# Patient Record
Sex: Male | Born: 2000 | Race: White | Hispanic: No | Marital: Single | Smoking: Never smoker
Health system: Southern US, Community
[De-identification: ages and names within clinical notes are randomized; demographics above are authoritative.]

## PROBLEM LIST (undated history)

## (undated) HISTORY — PX: APPENDECTOMY: SHX54

---
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
## 2020-07-12 IMAGING — CT CT HEAD W/O CM
4 series · 17 of 47 positions shown, 19 images · non-contrast
Comparison: None.

CLINICAL DATA: Headache

EXAM:
CT HEAD WITHOUT CONTRAST
TECHNIQUE: Contiguous axial images were obtained from the base of the skull
through the vertex without intravenous contrast.

[Series 3: head without · axial · non-contrast · 0.41mm/px · z∈[-80,+30]mm · 7 of 30 slices shown, 9 images]
[im 4/30  brain]
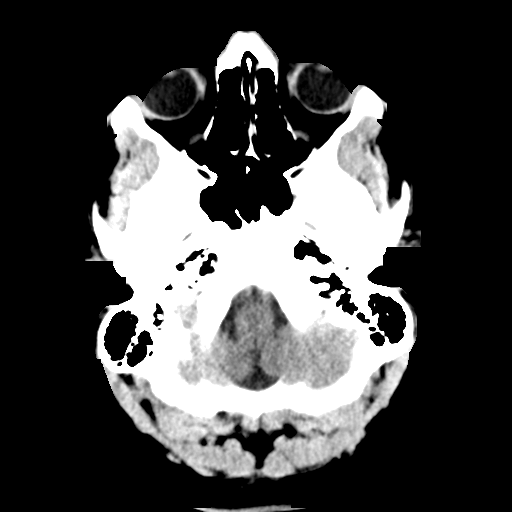
[im 4/30  bone]
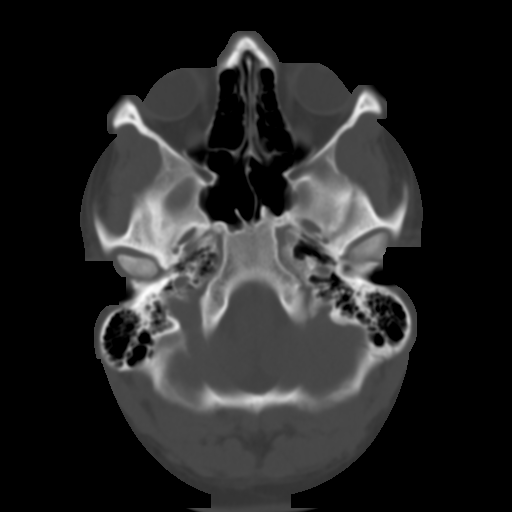
[im 8/30  brain]
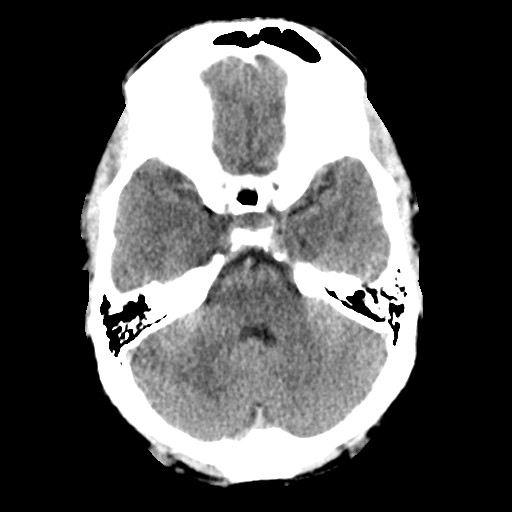
[im 11/30  brain]
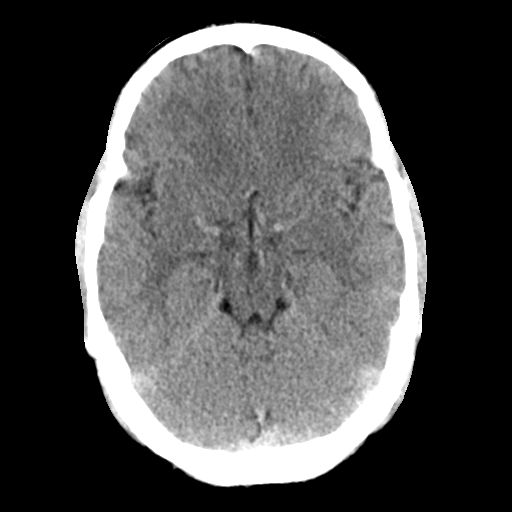
[im 15/30  brain]
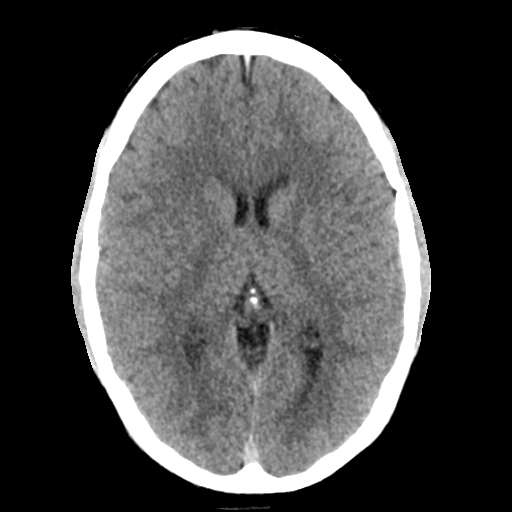
[im 19/30  brain]
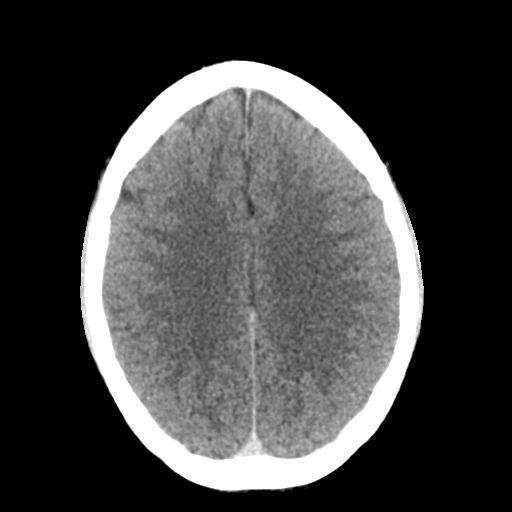
[im 19/30  bone]
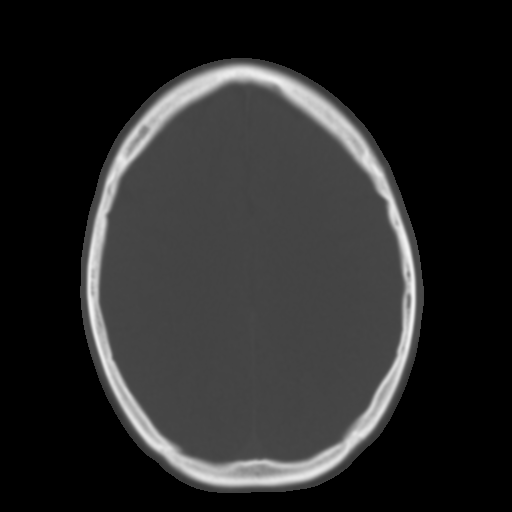
[im 22/30  brain]
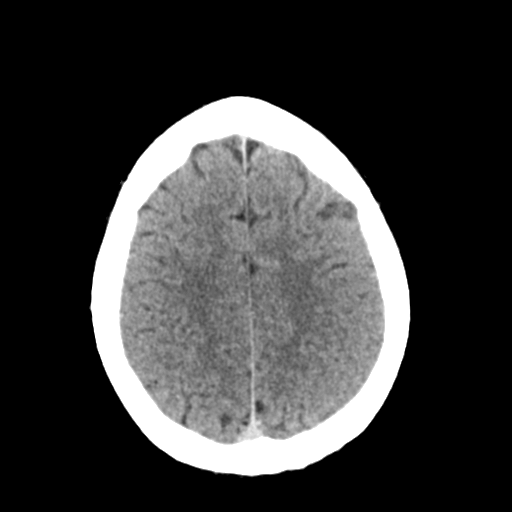
[im 26/30  brain]
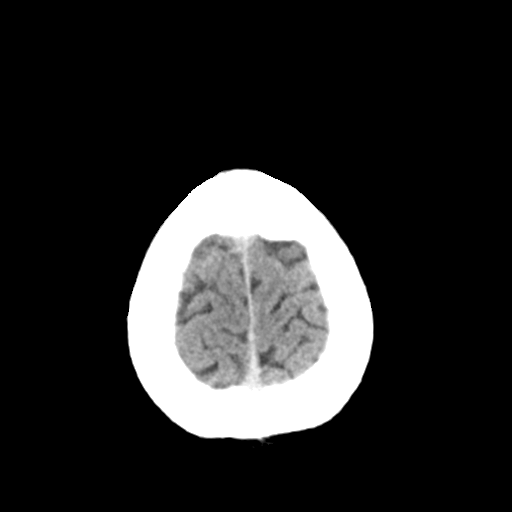

[Series 4: head bone · axial · 0.41mm/px · z∈[-81,-31]mm · 4 of 74 slices shown]
[im 8/74  bone]
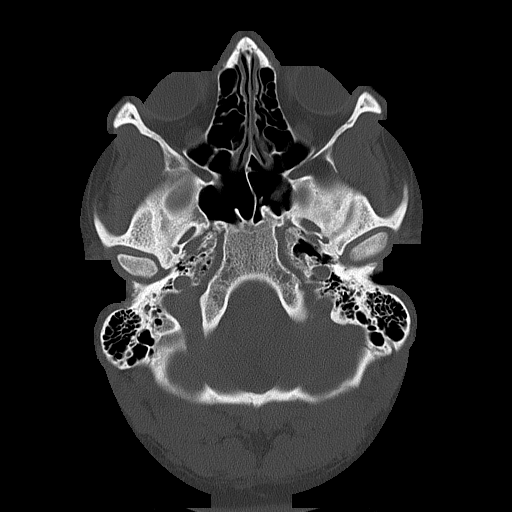
[im 15/74  bone]
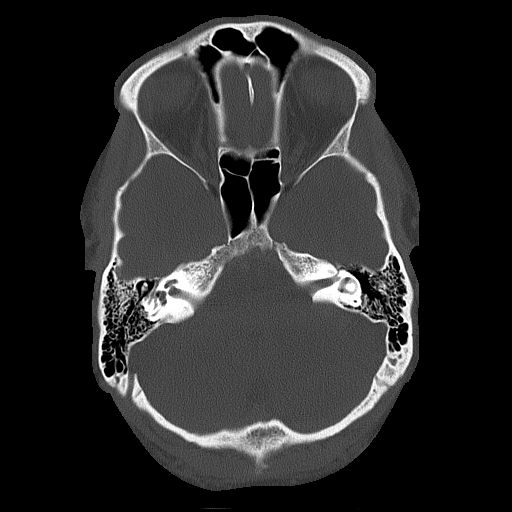
[im 22/74  bone]
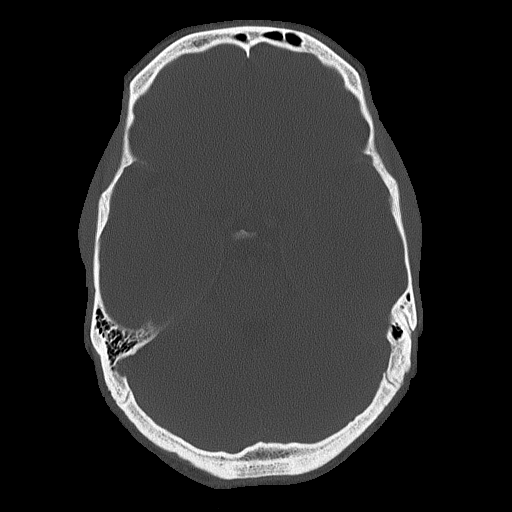
[im 33/74  bone]
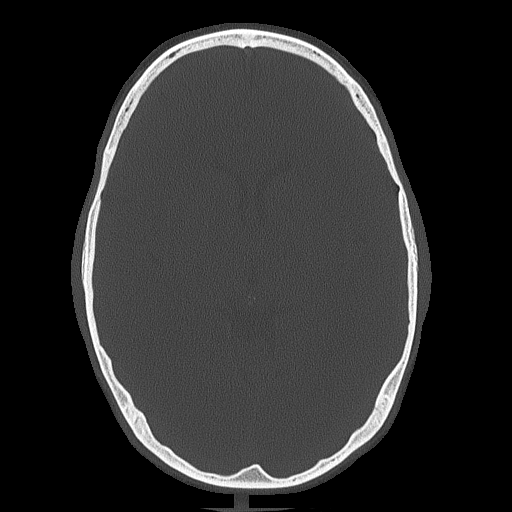

[Series 5: head without cor · coronal · non-contrast · 0.30mm/px · 3 of 67 slices shown]
[im 23/67  brain]
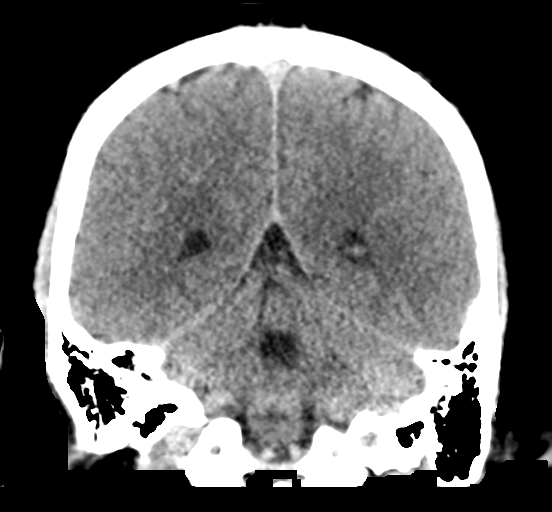
[im 30/67  brain]
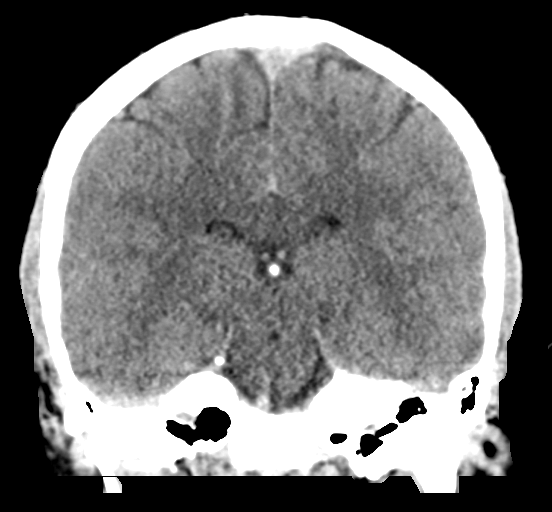
[im 37/67  brain]
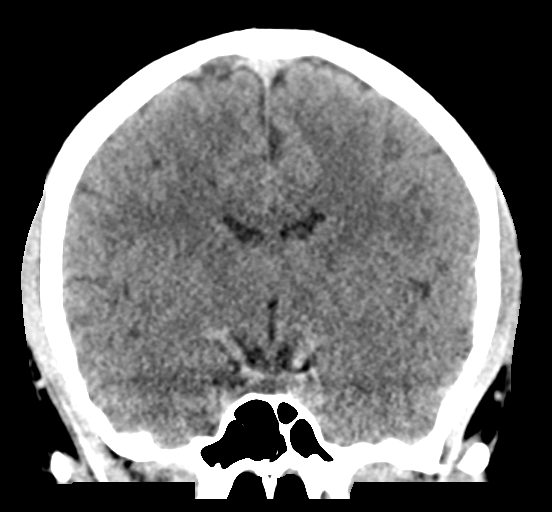

[Series 6: head without sag · sagittal · non-contrast · 0.30mm/px · 3 of 56 slices shown]
[im 19/56  brain]
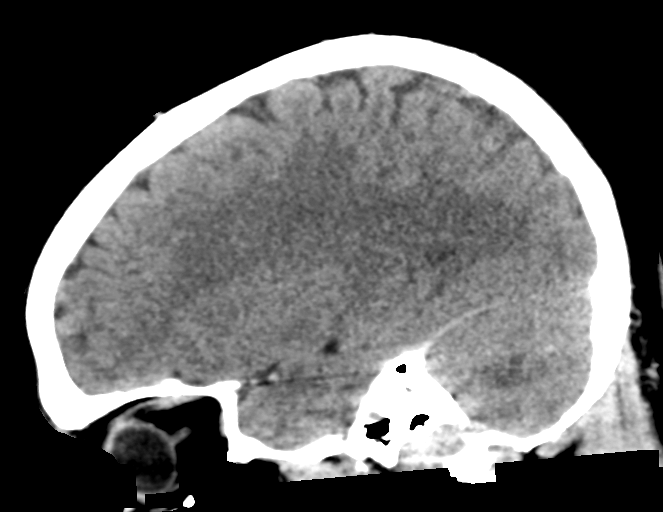
[im 28/56  brain]
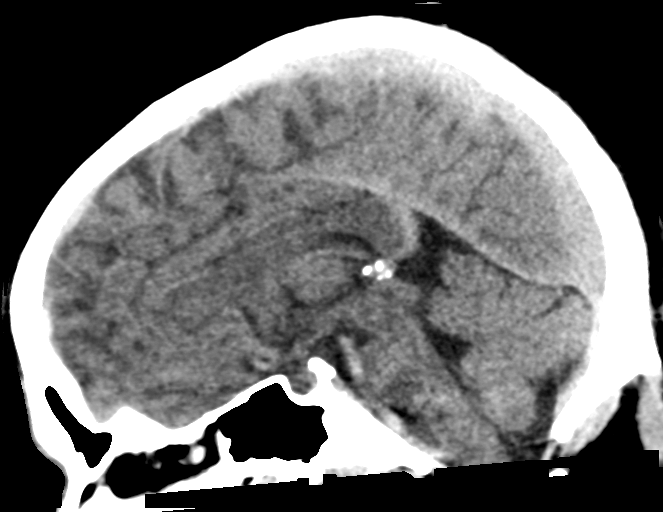
[im 37/56  brain]
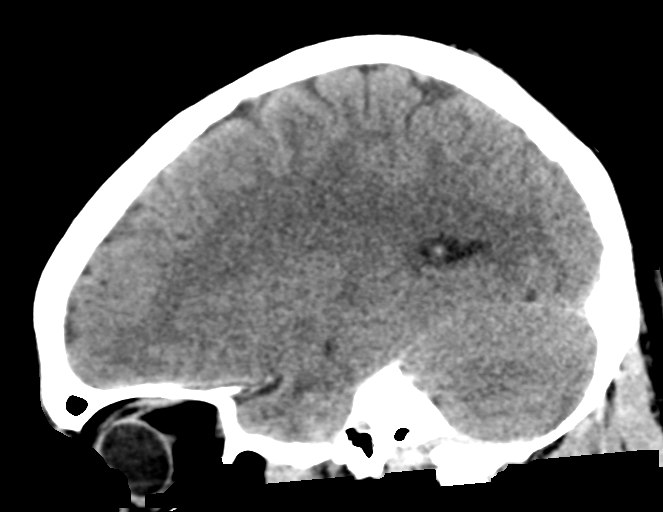

[17 of 47 positions shown; findings below may reference images not displayed]

FINDINGS: Brain: No acute intracranial abnormality. Specifically, no
hemorrhage, hydrocephalus, mass lesion, acute infarction, or
significant intracranial injury.

Vascular: No hyperdense vessel or unexpected calcification.

Skull: No acute calvarial abnormality.

Sinuses/Orbits: Visualized paranasal sinuses and mastoids clear.
Orbital soft tissues unremarkable.

Other: None
IMPRESSION: Normal study.

## 2022-03-25 ENCOUNTER — Emergency Department (HOSPITAL_COMMUNITY): Payer: Self-pay

## 2022-03-25 ENCOUNTER — Encounter (HOSPITAL_COMMUNITY): Payer: Self-pay

## 2022-03-25 ENCOUNTER — Other Ambulatory Visit: Payer: Self-pay

## 2022-03-25 ENCOUNTER — Emergency Department (HOSPITAL_COMMUNITY)
Admission: EM | Admit: 2022-03-25 | Discharge: 2022-03-25 | Disposition: A | Discharge status: Home / Self Care | Payer: Self-pay | Attending: Emergency Medicine | Admitting: Emergency Medicine

## 2022-03-25 DIAGNOSIS — R61 Generalized hyperhidrosis: Secondary | ICD-10-CM | POA: Insufficient documentation

## 2022-03-25 DIAGNOSIS — S71102A Unspecified open wound, left thigh, initial encounter: Secondary | ICD-10-CM | POA: Insufficient documentation

## 2022-03-25 DIAGNOSIS — Z23 Encounter for immunization: Secondary | ICD-10-CM | POA: Insufficient documentation

## 2022-03-25 DIAGNOSIS — R531 Weakness: Secondary | ICD-10-CM | POA: Insufficient documentation

## 2022-03-25 DIAGNOSIS — W3400XA Accidental discharge from unspecified firearms or gun, initial encounter: Secondary | ICD-10-CM | POA: Insufficient documentation

## 2022-03-25 LAB — CBC
HCT: 43.6 % (ref 39.0–52.0)
HCT: 39.2 % (ref 39.0–52.0)
Hemoglobin: 14.6 g/dL (ref 13.0–17.0)
Hemoglobin: 13.7 g/dL (ref 13.0–17.0)
MCH: 29.4 pg (ref 26.0–34.0)
MCH: 30 pg (ref 26.0–34.0)
MCHC: 33.5 g/dL (ref 30.0–36.0)
MCHC: 34.9 g/dL (ref 30.0–36.0)
MCV: 87.7 fL (ref 80.0–100.0)
MCV: 85.8 fL (ref 80.0–100.0)
Platelets: 271 10*3/uL (ref 150–400)
Platelets: 204 10*3/uL (ref 150–400)
RBC: 4.97 MIL/uL (ref 4.22–5.81)
RBC: 4.57 MIL/uL (ref 4.22–5.81)
RDW: 12.4 % (ref 11.5–15.5)
RDW: 12.5 % (ref 11.5–15.5)
WBC: 9.9 10*3/uL (ref 4.0–10.5)
WBC: 12.6 10*3/uL — ABNORMAL HIGH (ref 4.0–10.5)
nRBC: 0 % (ref 0.0–0.2)
nRBC: 0 % (ref 0.0–0.2)

## 2022-03-25 LAB — URINALYSIS, ROUTINE W REFLEX MICROSCOPIC
Bilirubin Urine: NEGATIVE
Glucose, UA: NEGATIVE mg/dL
Hgb urine dipstick: NEGATIVE
Ketones, ur: NEGATIVE mg/dL
Leukocytes,Ua: NEGATIVE
Nitrite: NEGATIVE
Protein, ur: NEGATIVE mg/dL
Specific Gravity, Urine: 1.013 (ref 1.005–1.030)
pH: 7 (ref 5.0–8.0)

## 2022-03-25 LAB — COMPREHENSIVE METABOLIC PANEL
ALT: 28 U/L (ref 0–44)
AST: 29 U/L (ref 15–41)
Albumin: 4.4 g/dL (ref 3.5–5.0)
Alkaline Phosphatase: 86 U/L (ref 38–126)
Anion gap: 11 (ref 5–15)
BUN: 10 mg/dL (ref 6–20)
CO2: 23 mmol/L (ref 22–32)
Calcium: 9.4 mg/dL (ref 8.9–10.3)
Chloride: 105 mmol/L (ref 98–111)
Creatinine, Ser: 1.26 mg/dL — ABNORMAL HIGH (ref 0.61–1.24)
GFR, Estimated: >60 mL/min (ref >60)
Glucose, Bld: 149 mg/dL — ABNORMAL HIGH (ref 70–99)
Potassium: 3.6 mmol/L (ref 3.5–5.1)
Sodium: 139 mmol/L (ref 135–145)
Total Bilirubin: 0.9 mg/dL (ref 0.3–1.2)
Total Protein: 6.5 g/dL (ref 6.5–8.1)

## 2022-03-25 LAB — I-STAT CHEM 8, ED
BUN: 10 mg/dL (ref 6–20)
Calcium, Ion: 1.09 mmol/L — ABNORMAL LOW (ref 1.15–1.40)
Chloride: 104 mmol/L (ref 98–111)
Creatinine, Ser: 1.2 mg/dL (ref 0.61–1.24)
Glucose, Bld: 142 mg/dL — ABNORMAL HIGH (ref 70–99)
HCT: 43 % (ref 39.0–52.0)
Hemoglobin: 14.6 g/dL (ref 13.0–17.0)
Potassium: 3.4 mmol/L — ABNORMAL LOW (ref 3.5–5.1)
Sodium: 142 mmol/L (ref 135–145)
TCO2: 21 mmol/L — ABNORMAL LOW (ref 22–32)

## 2022-03-25 LAB — SAMPLE TO BLOOD BANK

## 2022-03-25 LAB — LACTIC ACID, PLASMA
Lactic Acid, Venous: 2.9 mmol/L (ref 0.5–1.9)
Lactic Acid, Venous: 0.9 mmol/L (ref 0.5–1.9)

## 2022-03-25 LAB — PROTIME-INR
INR: 1.1 (ref 0.8–1.2)
Prothrombin Time: 13.8 seconds (ref 11.4–15.2)

## 2022-03-25 LAB — ETHANOL: Alcohol, Ethyl (B): <10 mg/dL (ref <10)

## 2022-03-25 MED ORDER — KETOROLAC TROMETHAMINE 15 MG/ML IJ SOLN
15.0000 mg | Freq: Once | INTRAMUSCULAR | Status: AC
  Administered 2022-03-25: 15 mg via INTRAVENOUS
  Filled 2022-03-25: qty 1

## 2022-03-25 MED ORDER — OXYCODONE-ACETAMINOPHEN 5-325 MG PO TABS
1.0000 | ORAL_TABLET | Freq: Once | ORAL | Status: AC
  Administered 2022-03-25: 1 via ORAL
  Filled 2022-03-25: qty 1

## 2022-03-25 MED ORDER — HYDROCODONE-ACETAMINOPHEN 5-325 MG PO TABS: 1.0000 | ORAL_TABLET | Freq: Four times a day (QID) | ORAL | 0 refills | Status: AC | PRN

## 2022-03-25 MED ORDER — CEPHALEXIN 500 MG PO CAPS: 500.0000 mg | ORAL_CAPSULE | Freq: Two times a day (BID) | ORAL | 0 refills | Status: AC

## 2022-03-25 MED ORDER — TETANUS-DIPHTH-ACELL PERTUSSIS 5-2.5-18.5 LF-MCG/0.5 IM SUSY
0.5000 mL | PREFILLED_SYRINGE | Freq: Once | INTRAMUSCULAR | Status: AC
  Administered 2022-03-25: 0.5 mL via INTRAMUSCULAR

## 2022-03-25 MED ORDER — CEPHALEXIN 250 MG PO CAPS
500.0000 mg | ORAL_CAPSULE | Freq: Once | ORAL | Status: AC
  Administered 2022-03-25: 500 mg via ORAL
  Filled 2022-03-25: qty 2

## 2022-03-25 MED ORDER — LACTATED RINGERS IV BOLUS
1000.0000 mL | Freq: Once | INTRAVENOUS | Status: AC
  Administered 2022-03-25: 1000 mL via INTRAVENOUS

## 2022-03-25 MED ORDER — FENTANYL CITRATE PF 50 MCG/ML IJ SOSY
25.0000 ug | PREFILLED_SYRINGE | Freq: Once | INTRAMUSCULAR | Status: AC
  Administered 2022-03-25: 25 ug via INTRAVENOUS
  Filled 2022-03-25: qty 1

## 2022-03-25 NOTE — Progress Notes (Signed)
Orthopedic Tech Progress Note Patient Details:  Mauricio Dahlen [birth year] [id number]  Ortho Devices Type of Ortho Device: Crutches Ortho Device/Splint Interventions: Ordered, Application, Adjustment   Post Interventions Patient Tolerated: Well Instructions Provided: Adjustment of device, Care of device, Poper ambulation with device  Paije Goodhart L Charletha Dalpe 03/25/2022, 10:41 PM
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
## 2022-03-25 NOTE — Discharge Instructions (Addendum)
Take ibuprofen every 6 hours to minimize pain and soreness.  Call the number below to set up a follow-up appointment in the trauma clinic.  The following medications were sent to your pharmacy: -Keflex is an antibiotic to avoid an infection.  Take this as prescribed for the next 5 days. -Norco is a narcotic pain medication.  Take this only as needed.  Risks associated with these type of medications are addiction and depression of your respiratory drive.  Take with extreme caution.  Your pain should gradually begin to improve after tomorrow.  If you experience pain out of proportion or if areas of wound become increasingly red, swollen, painful, or begin draining pus, please return to the emergency department.
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
## 2022-03-25 NOTE — Progress Notes (Signed)
   03/25/22 1700  Clinical Encounter Type  Visited With Patient  Visit Type Initial;Trauma;ED   Chaplain met with Trauma Team as patient was admitted to ED - will remain available for any emotional or spiritual needs.  No famly present. No signs of emotional or spiritual distress at this time.
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
